# Patient Record
Sex: Male | Born: 1965 | Marital: Married | Smoking: Never smoker
Health system: Northeastern US, Community
[De-identification: ages and names within clinical notes are randomized; demographics above are authoritative.]

---

## 2021-10-18 IMAGING — MR MUSCULO^PUNHO
9 of 10 series · 36 of 40 positions shown · non-contrast
Comparison: none

[Series 10: coronal stir_(person_name)_1 · coronal · 3.0mm · 0.27mm/px · 3 of 12 slices shown]
[im 1/12]
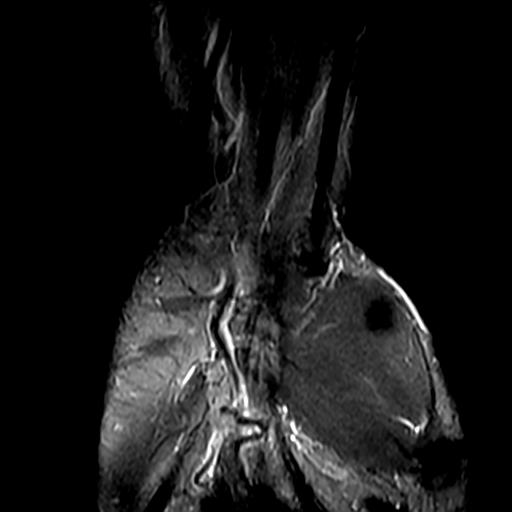
[im 6/12]
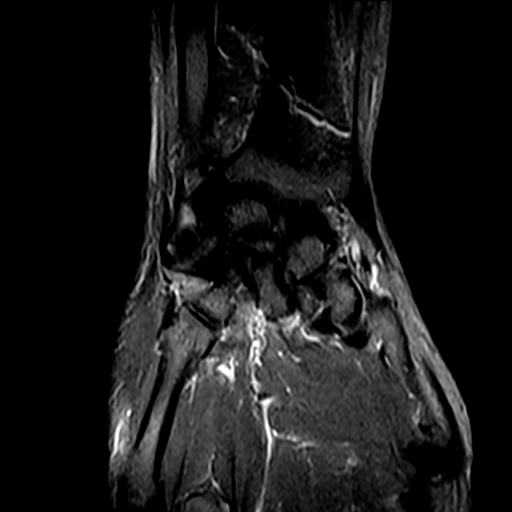
[im 12/12]
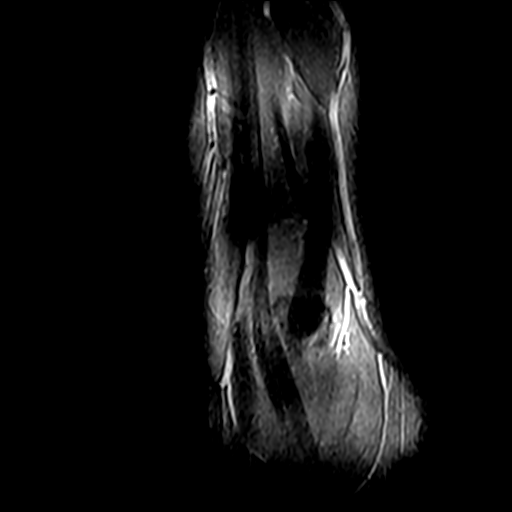

[Series 11: coronal t1_(person_name)_1 · coronal · 3.0mm · 0.41mm/px · 3 of 12 slices shown]
[im 1/12]
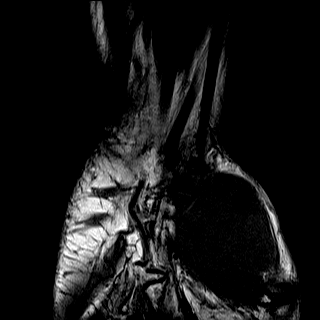
[im 6/12]
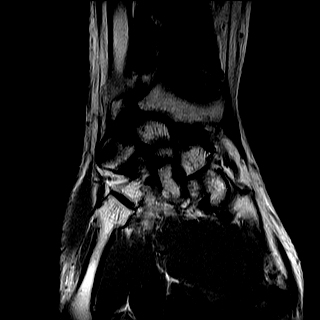
[im 12/12]
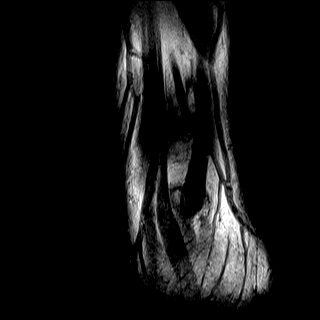

[Series 12: coronal gre_(person_name)_1 · coronal · 3.0mm · 0.27mm/px · 5 of 16 slices shown]
[im 1/16]
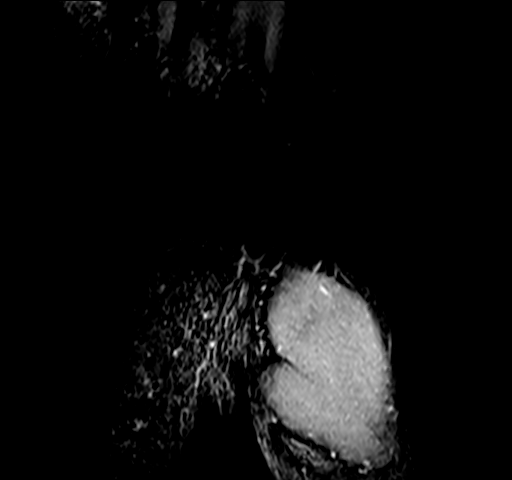
[im 4/16]
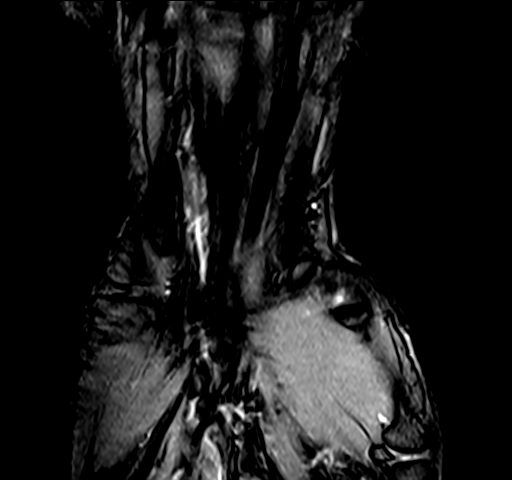
[im 8/16]
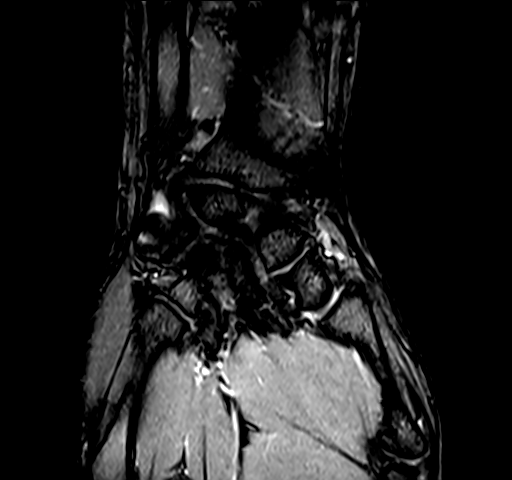
[im 12/16]
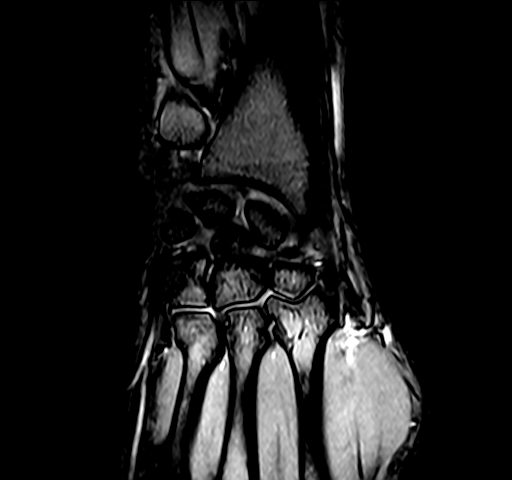
[im 16/16]
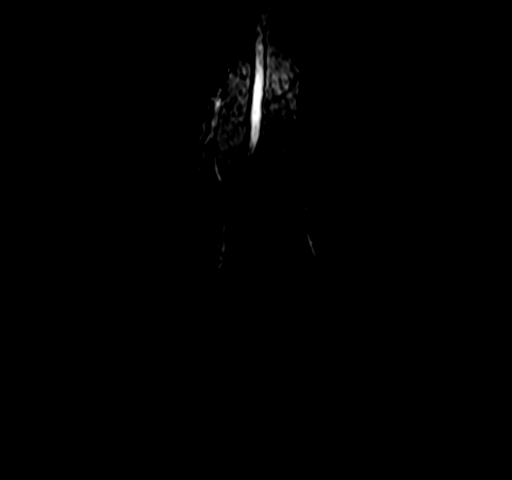

[Series 13: axial stir_(person_name)_1 · axial · 3.0mm · 0.27mm/px · z∈[+2,+67]mm · 5 of 16 slices shown]
[im 1/16]
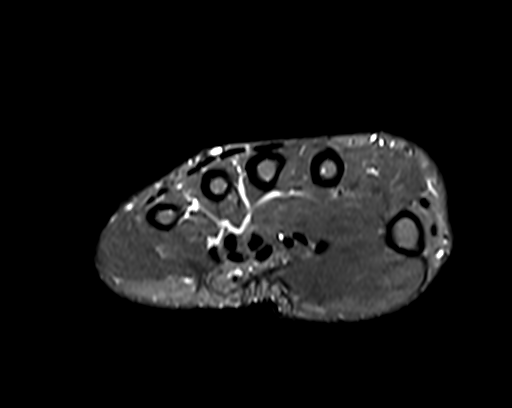
[im 4/16]
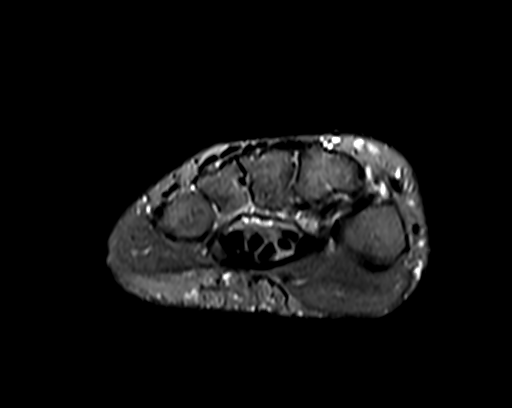
[im 8/16]
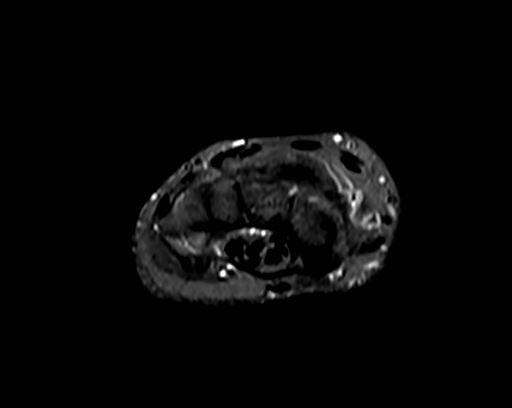
[im 12/16]
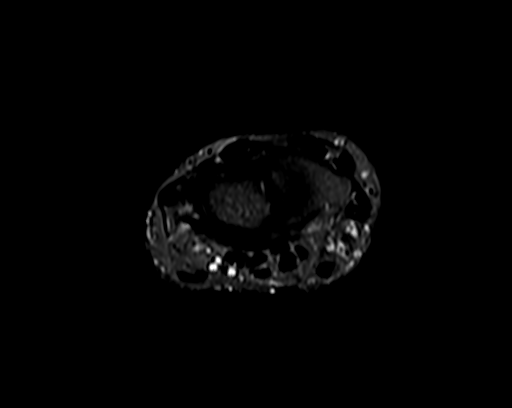
[im 16/16]
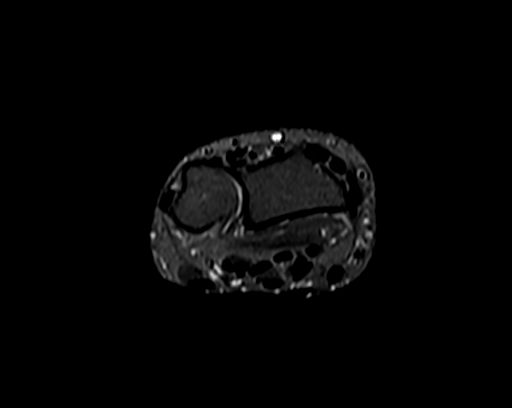

[Series 14: axial t1_(person_name)_1 · axial · 3.0mm · 0.20mm/px · z∈[+2,+50]mm · 4 of 16 slices shown]
[im 1/16]
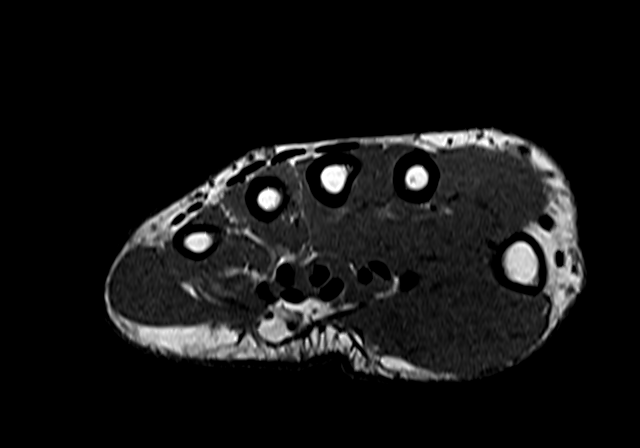
[im 4/16]
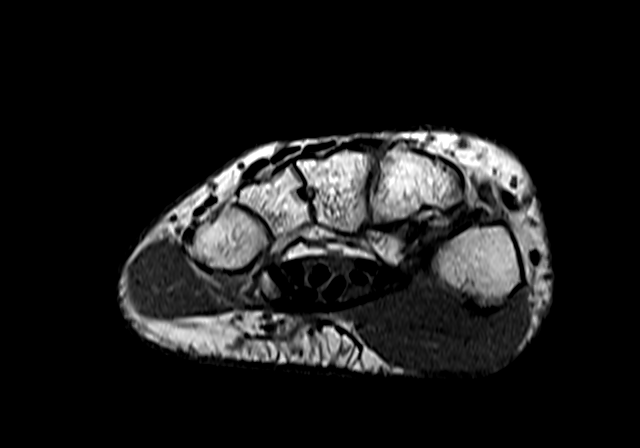
[im 8/16]
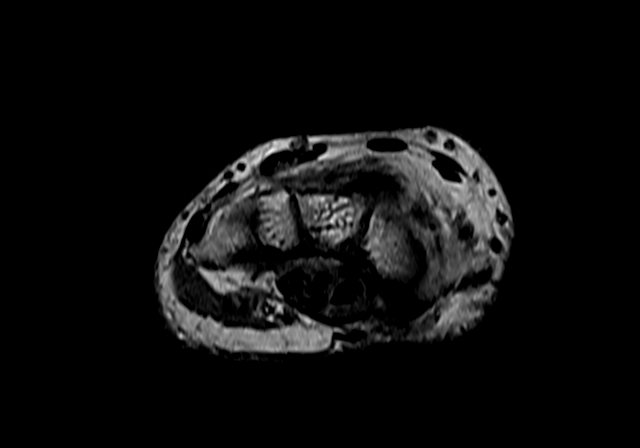
[im 12/16]
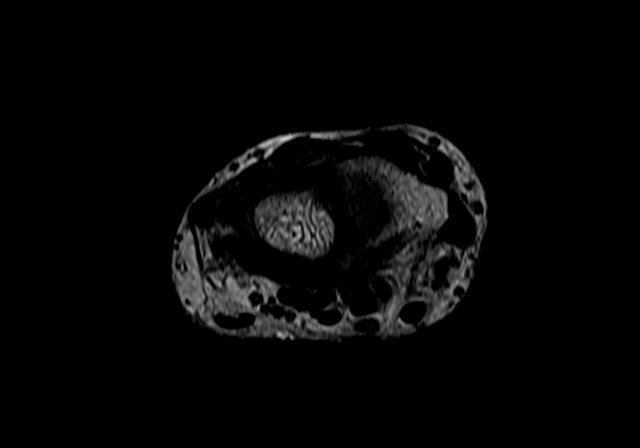

[Series 23: T1 · coronal · 3.0mm · 0.78mm/px · 3 of 12 slices shown (1 of 4)]
[im 1/12]
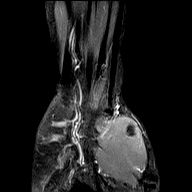
[im 6/12]
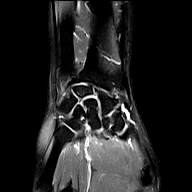
[im 12/12]
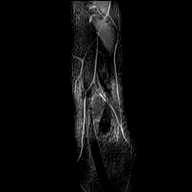

[Series 24: T1 · axial · 3.0mm · 0.62mm/px · z∈[-22,+43]mm · 5 of 16 slices shown (2 of 4)]
[im 1/16]
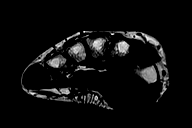
[im 4/16]
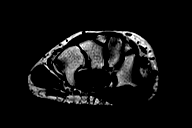
[im 8/16]
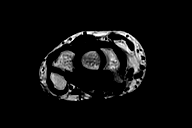
[im 12/16]
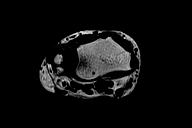
[im 16/16]
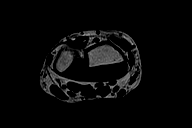

[Series 25: T1 · axial · 3.0mm · 0.62mm/px · z∈[-22,+43]mm · 5 of 16 slices shown (3 of 4)]
[im 1/16]
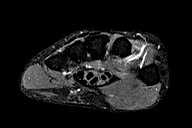
[im 4/16]
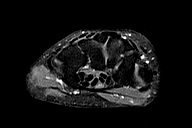
[im 8/16]
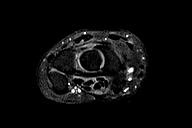
[im 12/16]
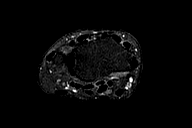
[im 16/16]
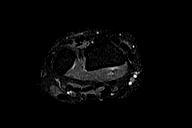

[Series 26: T1 · coronal · 3.0mm · 0.78mm/px · 3 of 12 slices shown (4 of 4)]
[im 1/12]
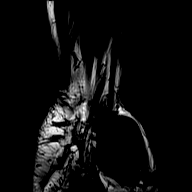
[im 6/12]
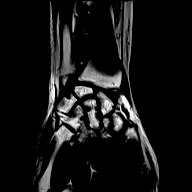
[im 12/12]
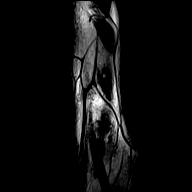

[36 of 40 positions shown; findings below may reference images not displayed]

Técnica:
Realizadas sequências multiplanares com ponderações em T1 e T2, com e sem supressão do sinal da
gordura. Foram realizadas sequências adicionais após infusão endovenosa do meio de contraste.
Relatório:
RESSONÂNCIA MAGNÉTICA DO PUNHO  DIREITO
Estruturas ósseas com aspecto habitual.
O alinhamento dos ossos do carpo está preservado. Espaço escafolunato mantido.
Ligamentos intrínsecos escafolunato e lunopiramidal íntegros.
Revestimentos condrais sem anormalidades.
Ausência de derrame articular ou sinovite.
Complexo da fibrocartilagem triangular sem alterações.
Subluxação parcial lateral do tendão extensor ulnar do carpo.
Tendões flexores e extensores com espessura e intensidade de sinal normais.
Nervo mediano com espessura mantida e sinal habitual. Os feixes vasculares não apresentam alterações.
Ausência de formações expansivas sólidas.
Impressão:
Subluxação parcial lateral do tendão extensor ulnar do carpo.

## 2021-10-18 IMAGING — MR MUSCULO^PUNHO
18 of 20 series · 36 of 40 positions shown · non-contrast
Comparison: none

[Series 3: STIR · coronal · 3.0mm · 0.27mm/px · 2 of 12 slices shown (1 of 3)]
[im 1/12]
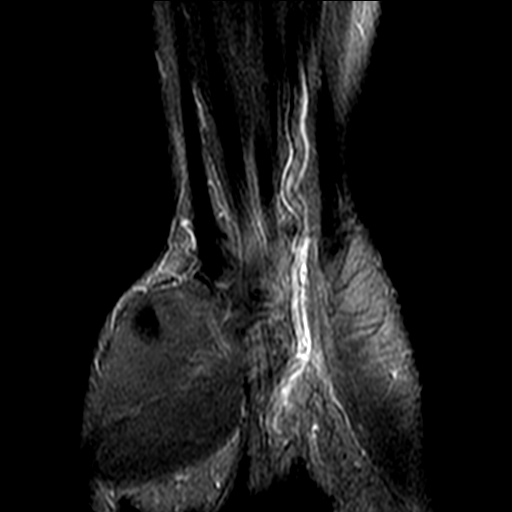
[im 12/12]
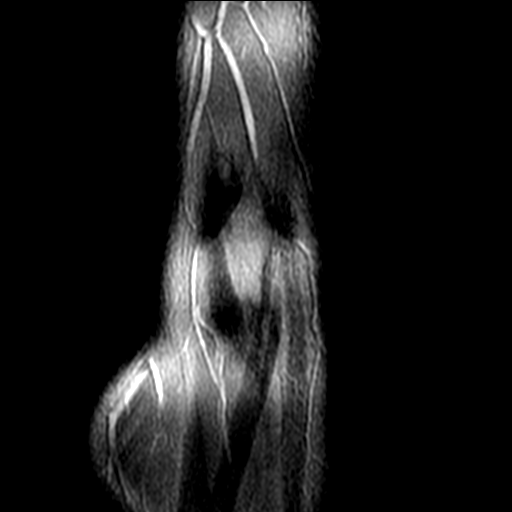

[Series 4: T1 · coronal · 3.0mm · 0.41mm/px · 2 of 12 slices shown (1 of 6)]
[im 1/12]
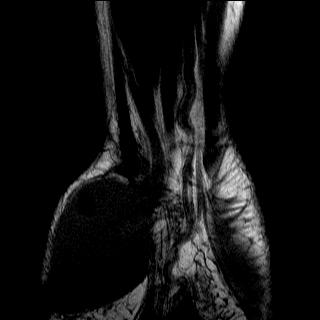
[im 12/12]
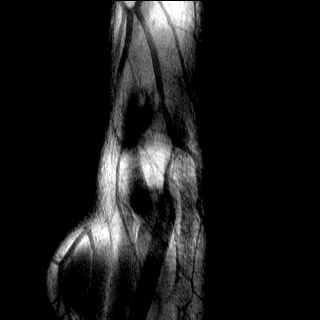

[Series 5: GRE · coronal · 3.0mm · 0.27mm/px · 2 of 16 slices shown]
[im 1/16]
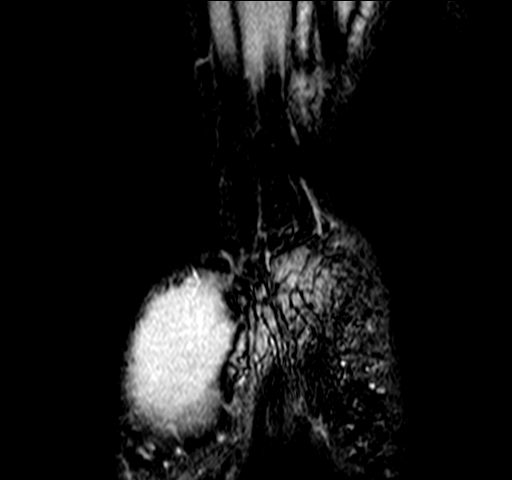
[im 16/16]
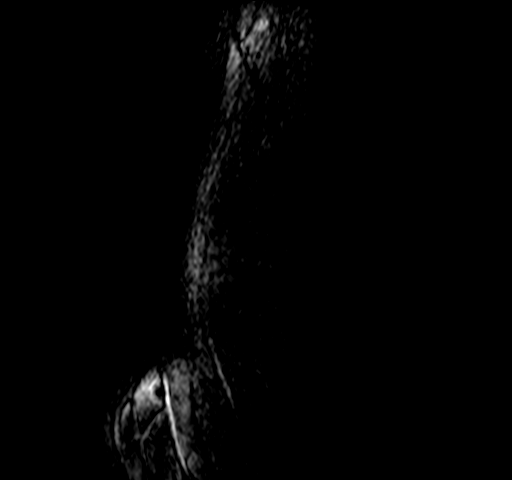

[Series 6: STIR · axial · 3.0mm · 0.27mm/px · z∈[-11,+54]mm · 2 of 16 slices shown (2 of 3)]
[im 1/16]
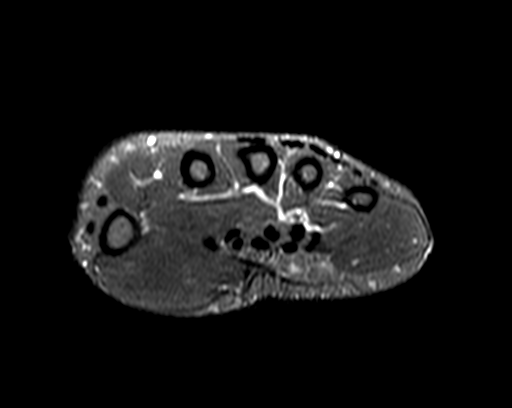
[im 16/16]
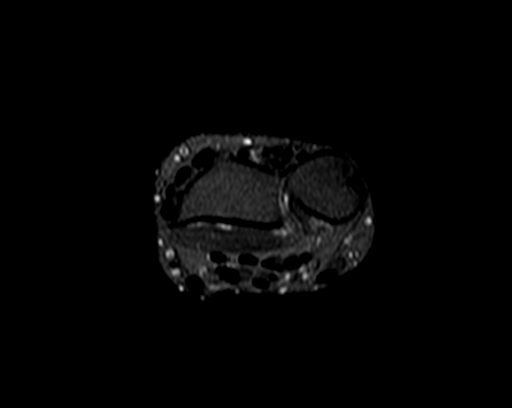

[Series 7: T1 · axial · 3.0mm · 0.20mm/px · z∈[-11,+54]mm · 2 of 16 slices shown (2 of 6)]
[im 1/16]
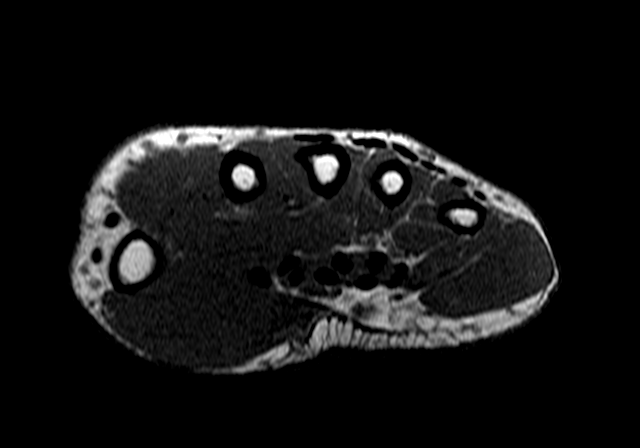
[im 16/16]
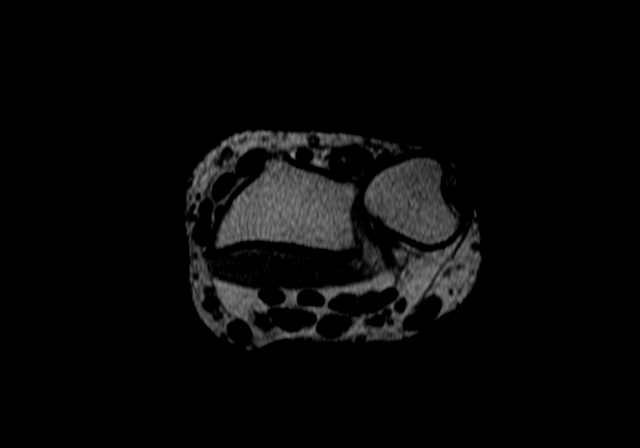

[Series 8: STIR · sagittal · 4.0mm · 0.27mm/px · 2 of 12 slices shown (3 of 3)]
[im 1/12]
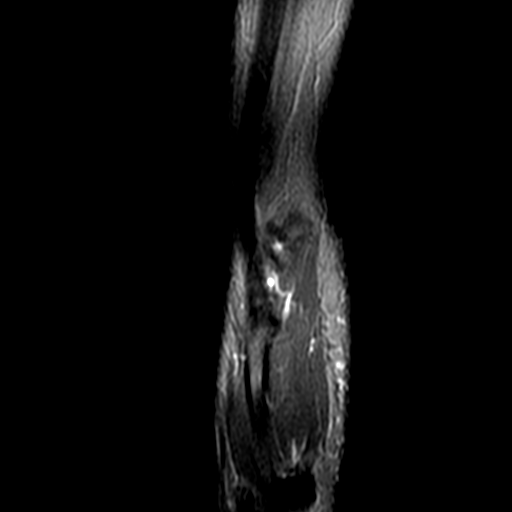
[im 12/12]
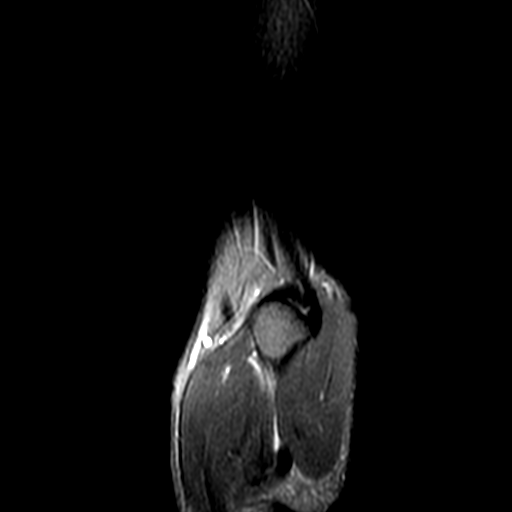

[Series 9: DIXON · axial · 3.0mm · 0.62mm/px · z∈[-15,+50]mm · 2 of 16 slices shown (1 of 4)]
[im 1/16]
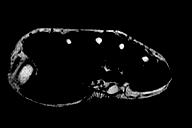
[im 16/16]
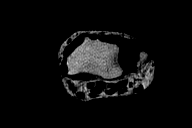

[Series 10: DIXON · axial · 3.0mm · 0.62mm/px · z∈[-15,+50]mm · 2 of 16 slices shown (2 of 4)]
[im 1/16]
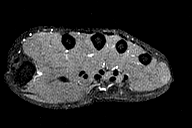
[im 16/16]
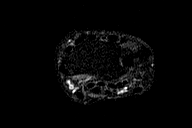

[Series 11: DIXON · coronal · 3.0mm · 0.78mm/px · 2 of 12 slices shown (3 of 4)]
[im 1/12]
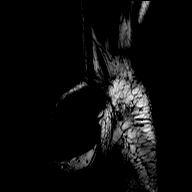
[im 12/12]
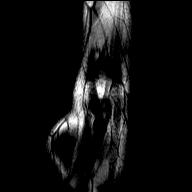

[Series 12: DIXON · coronal · 3.0mm · 0.78mm/px · 2 of 12 slices shown (4 of 4)]
[im 1/12]
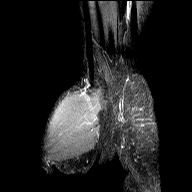
[im 12/12]
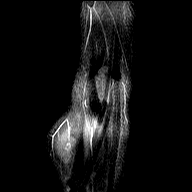

[Series 13: coronal t1_(person_name)_1 · coronal · 3.0mm · 0.41mm/px · 2 of 12 slices shown]
[im 1/12]
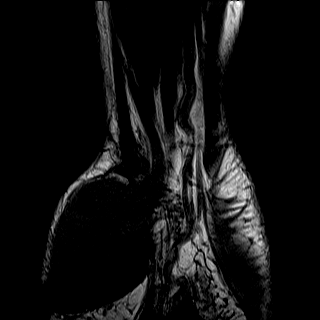
[im 12/12]
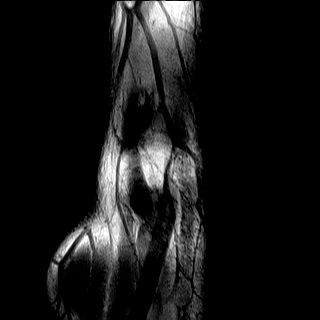

[Series 14: coronal stir_(person_name)_1 · coronal · 3.0mm · 0.27mm/px · 2 of 12 slices shown]
[im 1/12]
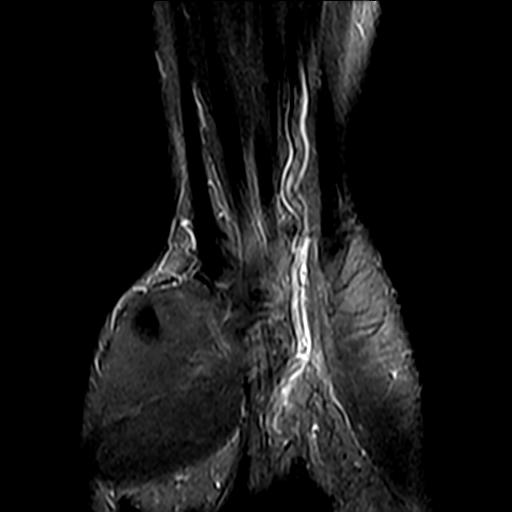
[im 12/12]
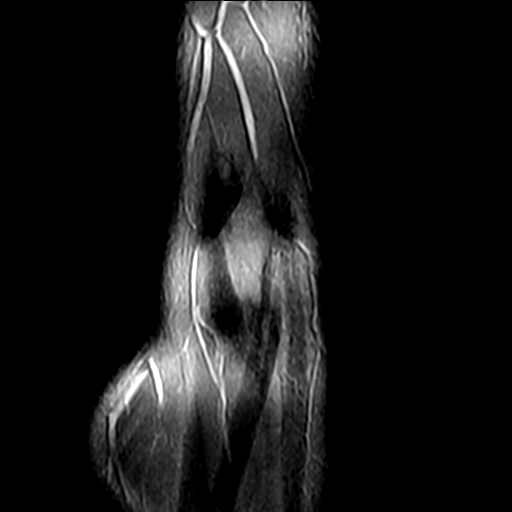

[Series 15: coronal gre_(person_name)_1 · coronal · 3.0mm · 0.27mm/px · 2 of 16 slices shown]
[im 1/16]
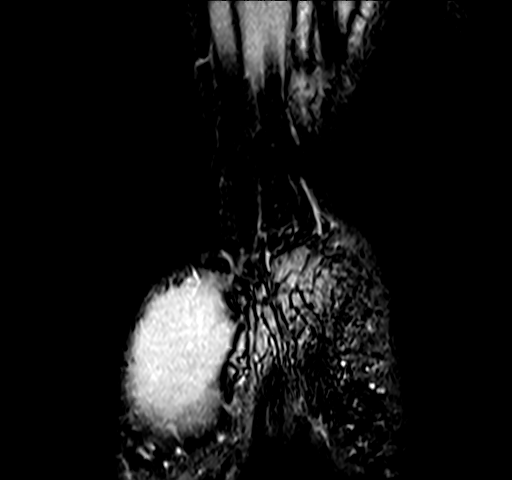
[im 16/16]
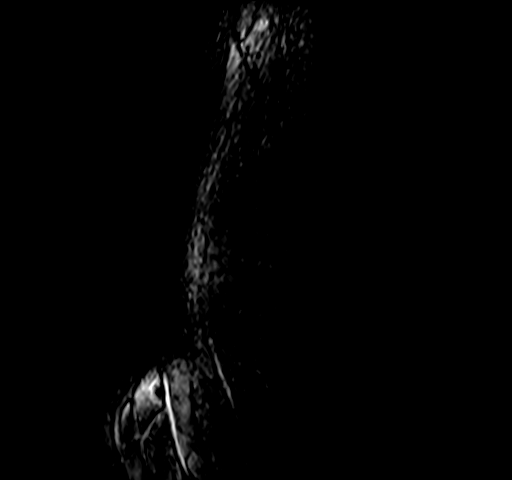

[Series 16: axial stir_(person_name)_1 · axial · 3.0mm · 0.27mm/px · z∈[-11,+54]mm · 2 of 16 slices shown]
[im 1/16]
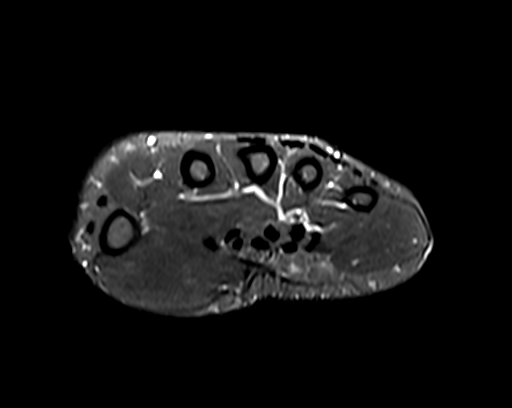
[im 16/16]
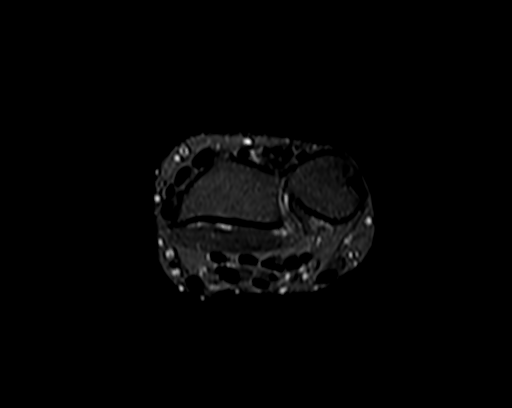

[Series 19: T1 · axial · 3.0mm · 0.62mm/px · z∈[-15,+50]mm · 2 of 16 slices shown (3 of 6)]
[im 1/16]
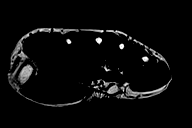
[im 16/16]
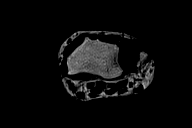

[Series 20: T1 · axial · 3.0mm · 0.62mm/px · z∈[-15,+50]mm · 2 of 16 slices shown (4 of 6)]
[im 1/16]
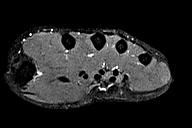
[im 16/16]
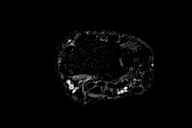

[Series 21: T1 · coronal · 3.0mm · 0.78mm/px · 2 of 12 slices shown (5 of 6)]
[im 1/12]
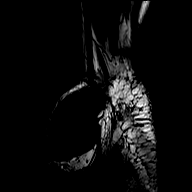
[im 12/12]
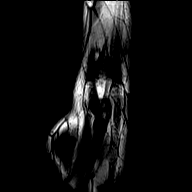

[Series 22: T1 · coronal · 3.0mm · 0.78mm/px · 2 of 12 slices shown (6 of 6)]
[im 1/12]
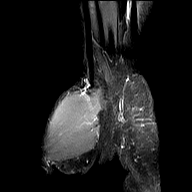
[im 12/12]
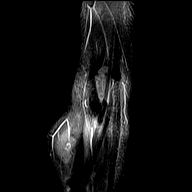

[36 of 40 positions shown; findings below may reference images not displayed]

Técnica:
Realizadas sequências multiplanares com ponderações em T1 e T2, com e sem supressão do sinal da
gordura. Foram realizadas sequências adicionais após infusão endovenosa do meio de contraste.
Relatório:
RESSONÂNCIA MAGNÉTICA DO PUNHO ESQUERDO
Estruturas ósseas com aspecto habitual.
O alinhamento dos ossos do carpo está preservado. Espaço escafolunato mantido.
Ligamentos intrínsecos escafolunato e lunopiramidal íntegros.
Revestimentos condrais sem anormalidades.
Ausência de derrame articular ou sinovite.
Complexo da fibrocartilagem triangular sem alterações.
Tendões flexores e extensores com espessura e intensidade de sinal normais.
Nervo mediano com espessura mantida e sinal habitual. Os feixes vasculares não apresentam alterações.
Ausência de formações expansivas sólidas.
Impressão:
Exame sem alterações significativas.

## 2021-12-03 LAB — HEMOGLOBIN A1C CARE EVERYWHERE: HEMOGLOBIN A1C CARE EVERYWHERE: 4.7 % (ref 4.1–5.7)

## 2022-10-21 ENCOUNTER — Emergency Department
Admission: EM | Admit: 2022-10-21 | Discharge: 2022-10-21 | Disposition: A | Discharge status: Home / Self Care | Payer: No Typology Code available for payment source | Attending: Student in an Organized Health Care Education/Training Program | Admitting: Student in an Organized Health Care Education/Training Program

## 2022-10-21 ENCOUNTER — Other Ambulatory Visit: Payer: Self-pay

## 2022-10-21 ENCOUNTER — Encounter (HOSPITAL_BASED_OUTPATIENT_CLINIC_OR_DEPARTMENT_OTHER): Payer: Self-pay | Admitting: Medical

## 2022-10-21 DIAGNOSIS — Y929 Unspecified place or not applicable: Secondary | ICD-10-CM | POA: Diagnosis not present

## 2022-10-21 DIAGNOSIS — X500XXA Overexertion from strenuous movement or load, initial encounter: Secondary | ICD-10-CM | POA: Insufficient documentation

## 2022-10-21 DIAGNOSIS — M545 Low back pain, unspecified: Secondary | ICD-10-CM | POA: Diagnosis present

## 2022-10-21 DIAGNOSIS — Y93E5 Activity, floor mopping and cleaning: Secondary | ICD-10-CM | POA: Insufficient documentation

## 2022-10-21 DIAGNOSIS — S39012A Strain of muscle, fascia and tendon of lower back, initial encounter: Secondary | ICD-10-CM | POA: Diagnosis not present

## 2022-10-21 MED ORDER — METHOCARBAMOL 750 MG PO TABS
750.00 mg | ORAL_TABLET | Freq: Four times a day (QID) | ORAL | 0 refills | Status: AC | PRN
Start: 2022-10-21 — End: 2022-10-25

## 2022-10-21 MED ORDER — KETOROLAC TROMETHAMINE 30 MG/ML INJ
30.0000 mg | Freq: Once | Status: AC
Start: 2022-10-21 — End: 2022-10-21
  Administered 2022-10-21: 30 mg via INTRAMUSCULAR
  Filled 2022-10-21: qty 1

## 2022-10-21 MED ORDER — LIDOCAINE 5 % EX PTCH
1.00 | MEDICATED_PATCH | CUTANEOUS | 0 refills | Status: AC
Start: 2022-10-21 — End: 2022-10-28

## 2022-10-21 NOTE — ED Provider Notes (Signed)
EMERGENCY DEPARTMENT  NOTE          History of Present Illness:    This 57 year old male patient presents with back pain.  Patient reports left-sided low back pain that started yesterday while he was using a mop.  He felt pain to the left low back which has been ongoing, also has some slight pain to the left upper back as well.  Pain is worse with movement, better with rest.  Patient took diclofenac at 4 AM.  Denies any extremity weakness/numbness, urinary retention/incontinence, fever/chills.        Physical Exam:  GENERAL:  57 year old male in no apparent distress.    VITAL SIGNS:  BP 117/74   Pulse 55   Temp 96.1 F   Resp 16   SpO2 98%     HEENT:  Head atraumatic.  PERRL.  Extraocular muscles intact.  Neck is supple.  CHEST:  Lungs are clear to auscultation bilaterally.  No wheeze or crackles.   CARDIAC:  RRR, S1/S2 present, no m/r/g     BACK:  No midline ttp, no flank ttp, there is some slight tenderness to the left low back musculature   ABDOMEN:  Soft, nontender  EXTREMITIES:  Warm and well perfused. Pulses are 2+ bilateral.  No edema.    SKIN:  Warm and dry.  No rash.    NEUROLOGIC: Alert and appropriate.         ED Course and Medical Decision Making: 57 year old male presenting with left-sided low back pain, injury seems consistent with muscular strain. Back pain obviously musculoskeletal on examination.  Patient does not have any concerning midline spinal tenderness, not concerning for spinal abscess, osteomyelitis, vertebral fracture.  Patient does not have any neurologic deficits or any concerning red flag symptoms, not concerning for cauda equina syndrome or spinal cord involvement.     Patient treated with Toradol injection, prescribed lidocaine patches and Robaxin           Orders:   Orders Placed This Encounter      ketorolac (TORADOL) injection 30 mg          Results:  No results found for this visit on 10/21/22 (from the past 24 hour(s)).          Critical Care Time: 0  minutes        PROVISIONARY DIAGNOSIS:  Back strain, initial encounter              Evalina Field, MD, 10/21/2022

## 2022-10-21 NOTE — Discharge Instructions (Addendum)
Continue diclofenac or ibuprofen for back pain.    You can also apply topical lidocaine patches, use muscle relaxer as prescribed.    Avoid heavy lifting/bending or painful activities.    Follow-up with a primary care doctor regarding any ongoing symptoms.  Return to the emergency department for any severe/worsening symptoms.

## 2022-10-21 NOTE — Narrator Note (Signed)
Patient Disposition  Patient education for diagnosis, medications, activity, diet and follow-up.  Patient left ED 11:03 AM.  Patient rep received written instructions.    Interpreter to provide instructions: No    Patient belongings with patient: YES    Have all existing LDAs been addressed? N/A    Have all IV infusions been stopped? N/A    Destination: Discharged to home

## 2022-10-21 NOTE — ED Triage Note (Signed)
Ambulatory to ER - reports while mopping floor yesterday felt pain in left lower back. Denies pain radiation. Last took diclofenac at 4am.
